# Patient Record
Sex: Female | Born: 2000 | Race: White | Hispanic: No | Marital: Single | Smoking: Current some day smoker
Health system: Southern US, Community
[De-identification: ages and names within clinical notes are randomized; demographics above are authoritative.]

---
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
## 2019-02-25 IMAGING — CT CT HEAD W/O CM
3 series · 16 of 47 positions shown, 19 images · non-contrast
Comparison: None.

CLINICAL DATA: Scalp laceration, ETOH.

EXAM:
CT HEAD WITHOUT CONTRAST
TECHNIQUE: Contiguous axial images were obtained from the base of the skull
through the vertex without intravenous contrast.

[Series 3: head wo · axial · 0.38mm/px · z∈[-105,+20]mm · 10 of 30 slices shown, 13 images]
[im 3/30  brain]
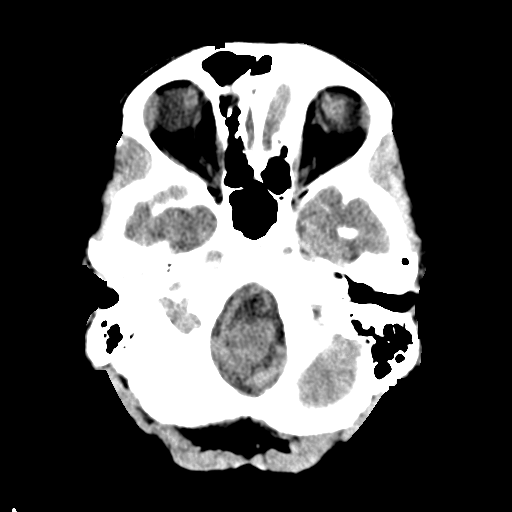
[im 3/30  bone]
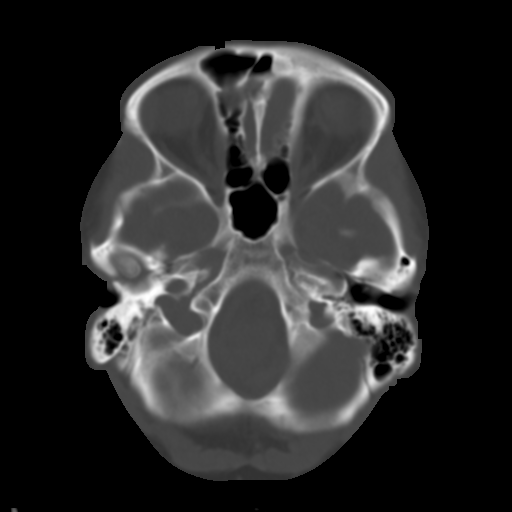
[im 6/30  brain]
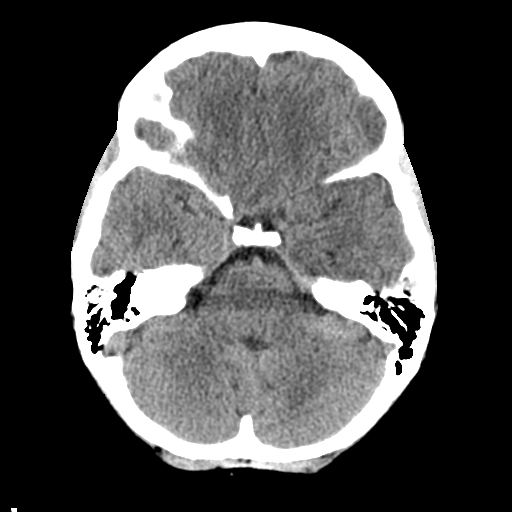
[im 9/30  brain]
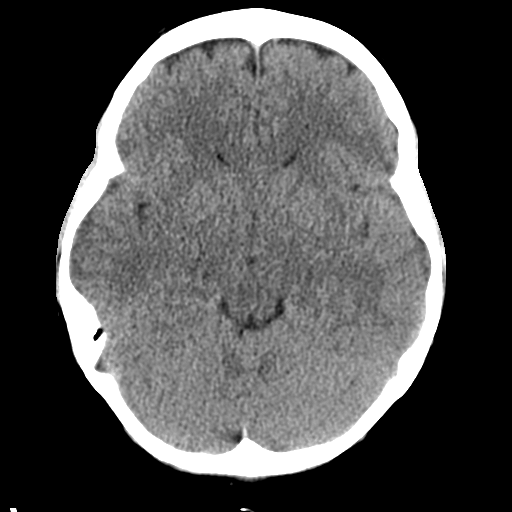
[im 11/30  brain]
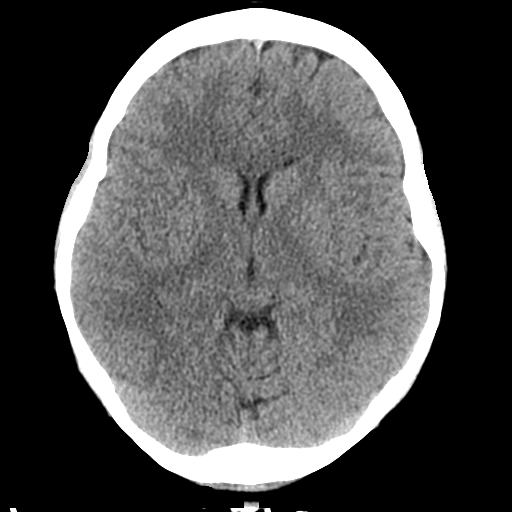
[im 14/30  brain]
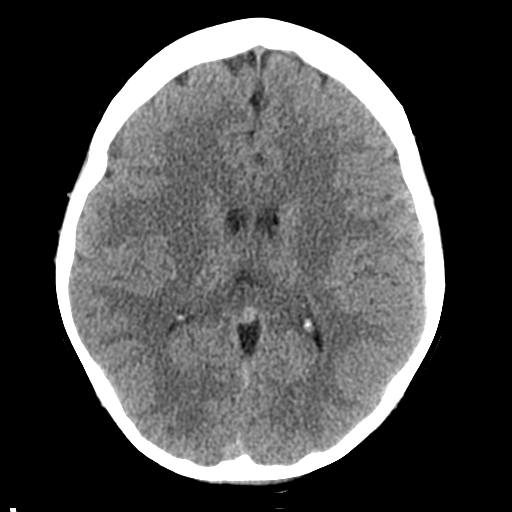
[im 14/30  bone]
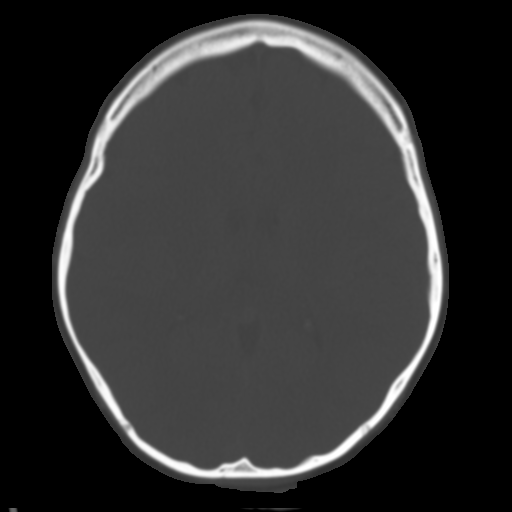
[im 17/30  brain]
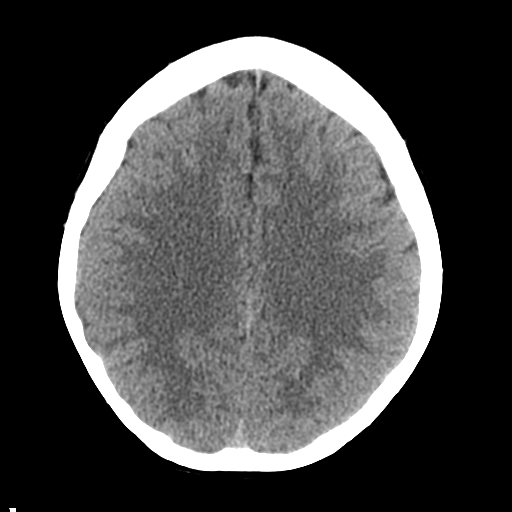
[im 20/30  brain]
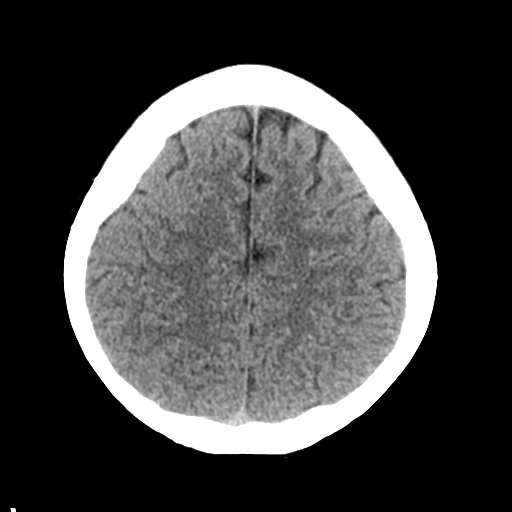
[im 23/30  brain]
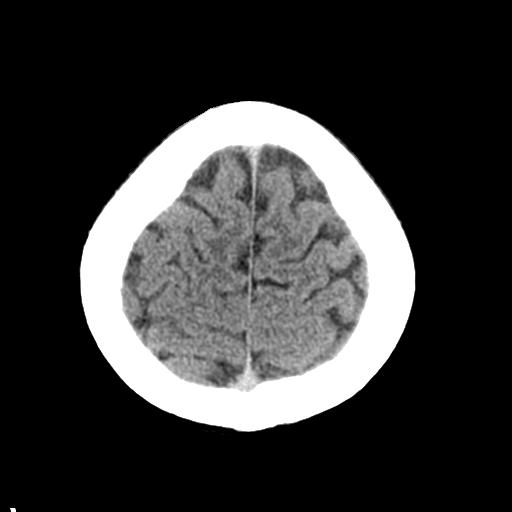
[im 25/30  brain]
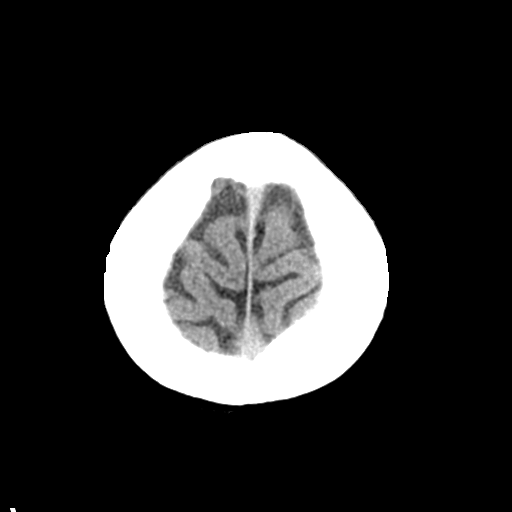
[im 25/30  bone]
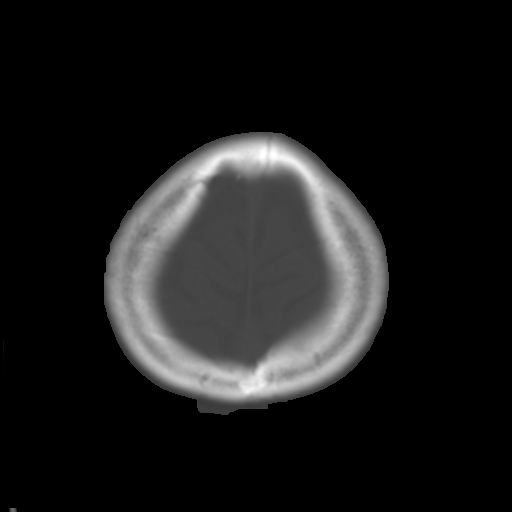
[im 28/30  brain]
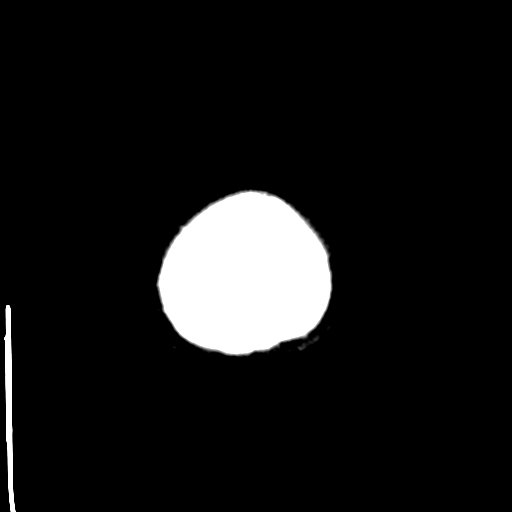

[Series 4: coronal soft tissue · coronal · 0.30mm/px · 3 of 61 slices shown]
[im 21/61  brain]
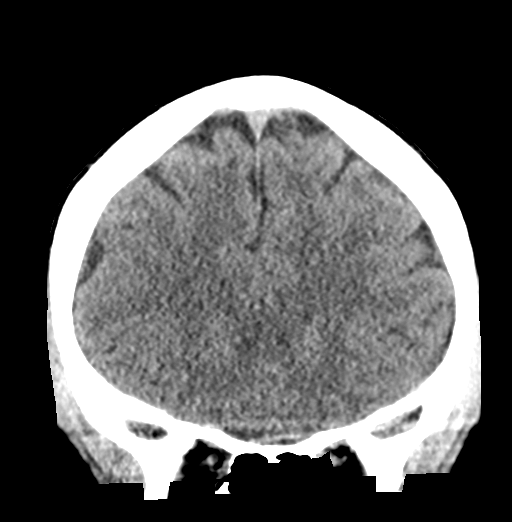
[im 27/61  brain]
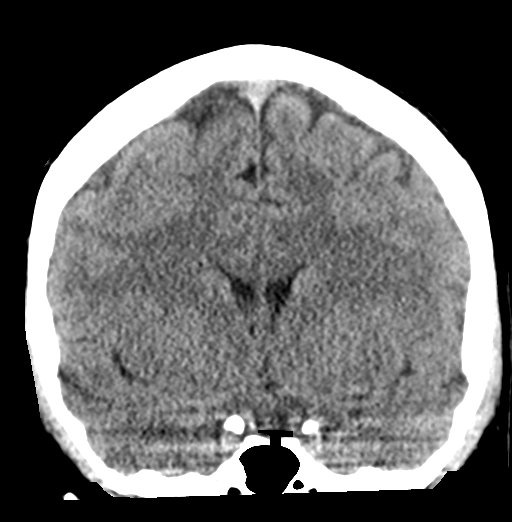
[im 34/61  brain]
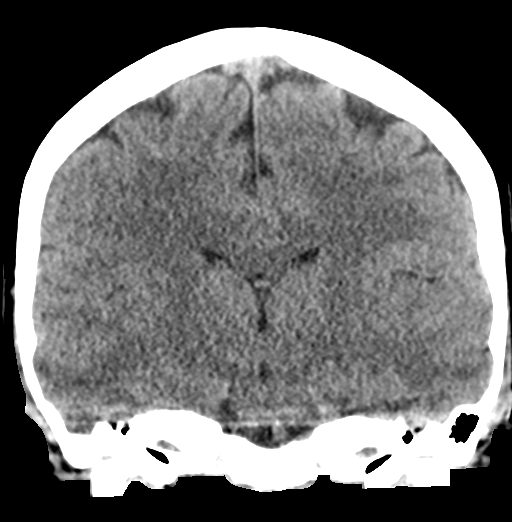

[Series 5: sagittal soft tissue · sagittal · 0.31mm/px · 3 of 51 slices shown]
[im 17/51  brain]
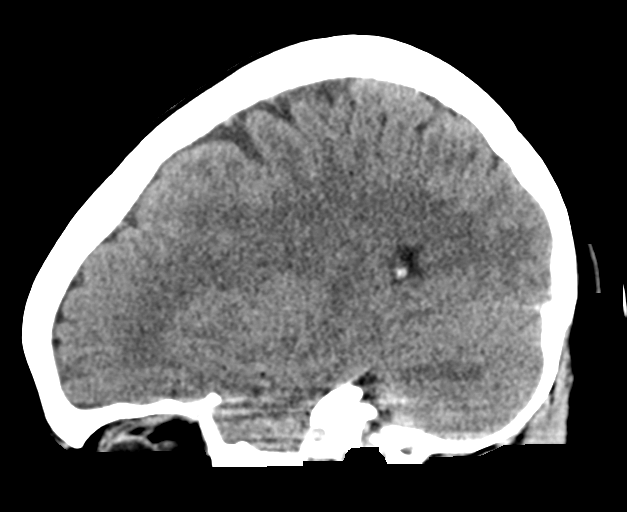
[im 26/51  brain]
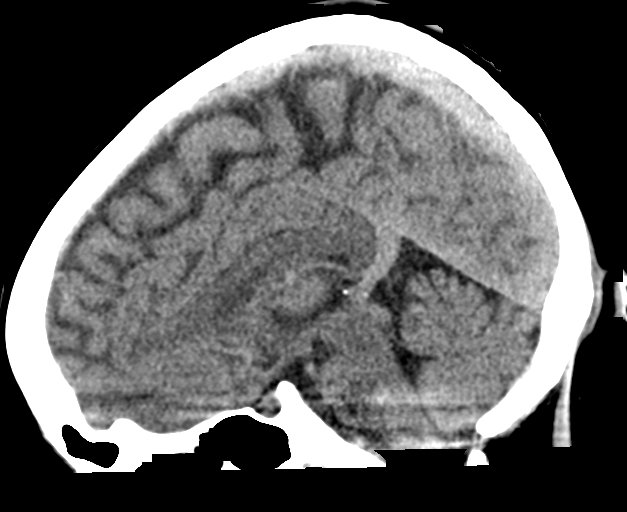
[im 34/51  brain]
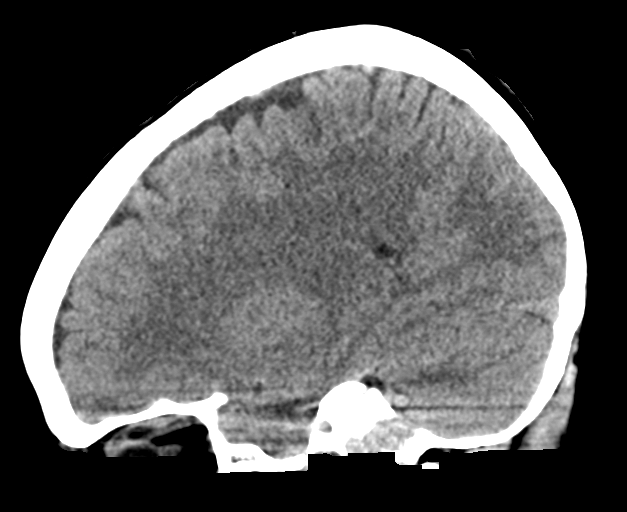

[16 of 47 positions shown; findings below may reference images not displayed]

FINDINGS: Brain: No intracranial hemorrhage, mass effect, or midline shift. No
hydrocephalus. The basilar cisterns are patent. No evidence of
territorial infarct or acute ischemia. No extra-axial or
intracranial fluid collection.

Vascular: No hyperdense vessel or unexpected calcification.

Skull: No fracture or focal lesion.

Sinuses/Orbits: Paranasal sinuses and mastoid air cells are clear.
The visualized orbits are unremarkable.

Other: Small posterior scalp laceration.
IMPRESSION: Small posterior scalp laceration.  Otherwise negative head CT.

## 2019-12-28 ENCOUNTER — Ambulatory Visit: Payer: BLUE CROSS/BLUE SHIELD | Attending: Family Medicine

## 2019-12-28 DIAGNOSIS — Z23 Encounter for immunization: Secondary | ICD-10-CM

## 2019-12-28 NOTE — Progress Notes (Signed)
   Covid-19 Vaccination Clinic  Name:  Jasmine Kent    MRN: [id number] DOB: 04/03/2001  12/28/2019  [name] was observed post Covid-19 immunization for 15 minutes without incident. She was provided with Vaccine Information Sheet and instruction to access the V-Safe system.   [name] was instructed to call 911 with any severe reactions post vaccine: Marland Kitchen Difficulty breathing  . Swelling of face and throat  . A fast heartbeat  . A bad rash all over body  . Dizziness and weakness   Immunizations Administered    Name Date Dose VIS Date Route   Pfizer COVID-19 Vaccine 12/28/2019  2:38 PM 0.3 mL 09/12/2019 Intramuscular   Manufacturer: [employer], [employer]   Lot: [id number]   NDC: [address]-5

## 2020-01-20 ENCOUNTER — Ambulatory Visit: Payer: BLUE CROSS/BLUE SHIELD | Attending: Internal Medicine

## 2020-01-20 DIAGNOSIS — Z23 Encounter for immunization: Secondary | ICD-10-CM

## 2020-01-20 NOTE — Progress Notes (Signed)
   Covid-19 Vaccination Clinic  Name:  Jasmine Kent    MRN: [id number] DOB: Feb 02, 2001  01/20/2020  [name] was observed post Covid-19 immunization for 15 minutes without incident. She was provided with Vaccine Information Sheet and instruction to access the V-Safe system.   [name] was instructed to call 911 with any severe reactions post vaccine: Marland Kitchen Difficulty breathing  . Swelling of face and throat  . A fast heartbeat  . A bad rash all over body  . Dizziness and weakness   Immunizations Administered    Name Date Dose VIS Date Route   Pfizer COVID-19 Vaccine 01/20/2020  3:00 PM 0.3 mL 11/26/2018 Intramuscular   Manufacturer: [employer], [employer]   Lot: [id number]   NDC: [address]-1
